# Patient Record
Sex: Male | Born: 1990 | Race: Black or African American | Hispanic: No | Marital: Single | Smoking: Current every day smoker
Health system: Southern US, Community
[De-identification: ages and names within clinical notes are randomized; demographics above are authoritative.]

## PROBLEM LIST (undated history)

## (undated) DIAGNOSIS — Y249XXA Unspecified firearm discharge, undetermined intent, initial encounter: Secondary | ICD-10-CM

## (undated) DIAGNOSIS — W3400XA Accidental discharge from unspecified firearms or gun, initial encounter: Secondary | ICD-10-CM

## (undated) HISTORY — PX: HAND SURGERY: SHX662

---
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
## 2012-09-08 ENCOUNTER — Encounter (HOSPITAL_BASED_OUTPATIENT_CLINIC_OR_DEPARTMENT_OTHER): Payer: Self-pay | Admitting: Family Medicine

## 2012-09-08 ENCOUNTER — Emergency Department (HOSPITAL_BASED_OUTPATIENT_CLINIC_OR_DEPARTMENT_OTHER)
Admission: EM | Admit: 2012-09-08 | Discharge: 2012-09-08 | Disposition: A | Discharge status: Home / Self Care | Attending: Emergency Medicine | Admitting: Emergency Medicine

## 2012-09-08 DIAGNOSIS — H53149 Visual discomfort, unspecified: Secondary | ICD-10-CM | POA: Insufficient documentation

## 2012-09-08 DIAGNOSIS — H109 Unspecified conjunctivitis: Secondary | ICD-10-CM | POA: Insufficient documentation

## 2012-09-08 DIAGNOSIS — H5789 Other specified disorders of eye and adnexa: Secondary | ICD-10-CM | POA: Insufficient documentation

## 2012-09-08 DIAGNOSIS — H579 Unspecified disorder of eye and adnexa: Secondary | ICD-10-CM | POA: Insufficient documentation

## 2012-09-08 MED ORDER — FLUORESCEIN SODIUM 1 MG OP STRP
ORAL_STRIP | OPHTHALMIC | Status: AC
  Administered 2012-09-08: 1 via OPHTHALMIC
  Filled 2012-09-08: qty 1

## 2012-09-08 MED ORDER — FLUORESCEIN SODIUM 1 MG OP STRP
ORAL_STRIP | OPHTHALMIC | Status: AC
  Administered 2012-09-08: 1
  Filled 2012-09-08: qty 1

## 2012-09-08 MED ORDER — TETRACAINE HCL 0.5 % OP SOLN
2.0000 [drp] | Freq: Once | OPHTHALMIC | Status: AC
  Administered 2012-09-08: 2 [drp] via OPHTHALMIC
  Filled 2012-09-08: qty 2

## 2012-09-08 MED ORDER — FLUORESCEIN SODIUM 1 MG OP STRP
ORAL_STRIP | OPHTHALMIC | Status: AC
  Administered 2012-09-08: 1
  Filled 2012-09-08: qty 2

## 2012-09-08 NOTE — ED Provider Notes (Signed)
History     CSN: [id number]  Arrival date & time 09/08/12  1112   First MD Initiated Contact with Patient 09/08/12 1200      Chief Complaint  Patient presents with  . Eye Problem    (Consider location/radiation/quality/duration/timing/severity/associated sxs/prior treatment) HPI Comments: Pt comes in with cc of eye problems. Pt started having bilateral ear redness and tearing 3-4 days ago. He is a prison inmate, and was started on opthalmic tobramycin and artificial tears - but overtime, he has increased redness and tearing from the eye. Pt denies any allergy hx, no similar sx in the past. He now has crusting of the eye in the morning, and yellowish discharge. No penile discharge, no hx of STD, no hx of autoimmune disease. Associated with the tearing patient has some light sensitivity - mostly on the left eye.   Patient is a 21 y.o. male presenting with eye problem. The history is provided by the patient.  Eye Problem  Associated symptoms include discharge, photophobia and eye redness.    History reviewed. No pertinent past medical history.  History reviewed. No pertinent past surgical history.  No family history on file.  History  Substance Use Topics  . Smoking status: Not on file  . Smokeless tobacco: Not on file  . Alcohol Use: Not on file      Review of Systems  Eyes: Positive for photophobia, pain, discharge, redness and itching. Negative for visual disturbance.  Respiratory: Negative for cough and shortness of breath.   Cardiovascular: Negative for chest pain.  Gastrointestinal: Negative for abdominal pain.  Genitourinary: Negative for dysuria, discharge and penile pain.    Allergies  Review of patient's allergies indicates no known allergies.  Home Medications   Current Outpatient Rx  Name  Route  Sig  Dispense  Refill  . TOBRAMYCIN SULFATE 0.3 % OP SOLN      1 drop every 4 (four) hours.           BP 129/91  Pulse 83  Temp 98.4 F (36.9 C)  (Oral)  Resp 16  Ht 5\' 9"  (1.753 m)  Wt 185 lb (83.915 kg)  BMI 27.32 kg/m2  SpO2 99%  Physical Exam  Nursing note and vitals reviewed. Constitutional: He is oriented to person, place, and time. He appears well-developed.  HENT:  Head: Normocephalic and atraumatic.  Eyes: EOM are normal. Pupils are equal, round, and reactive to light. Right eye exhibits discharge. Left eye exhibits discharge.       Bilateral eye exam shows periorbital edema, right worse than left. There is clear tearing noted. Pt has erythema of the eye, but there is no associated warmth to touch, and he has no eye pain when eyes are closed or with movement of the eyes. Slit lamp exam with fluorescein:  No abrasions, no ulcers, no foreign body. Pt does NOT have consensual photophobia, but endorses to direct photophobia   Neck: Normal range of motion. Neck supple.  Cardiovascular: Normal rate and regular rhythm.   Pulmonary/Chest: Effort normal and breath sounds normal.  Abdominal: Soft. Bowel sounds are normal. He exhibits no distension. There is no tenderness. There is no rebound and no guarding.  Neurological: He is alert and oriented to person, place, and time.  Skin: Skin is warm.    ED Course  Procedures (including critical care time)  Labs Reviewed - No data to display No results found.   1. Conjunctivitis of both eyes       MDM  Pt comes in with bilateral eye redness.  Vision is intact. No pain with eye movement  - no concerns for orbital cellulitis. No consensual photophobia  - so likely no keratits/uveitis - but he might be in the initial state of that condition. No contact len used.  Pt is on allergy meds, he is on tobramycin - which i think is appropriate for now. i will set up an appt with Opthalmology, to get a comprehensive eye exam, and to make sure his condition is not getting worse.  2:34 PM Set up an appt for Monday.    Derwood Kaplan, MD 09/08/12 1435

## 2012-09-08 NOTE — ED Notes (Addendum)
Pt c/o bilateral eye swelling and redness, burning and drainage right worse than left. Pt is incarcerated and here in custody. Pt currently of [employer] and being treated for pink eye with Tobramycin drops.

## 2012-09-08 NOTE — ED Notes (Signed)
Visual acuity. Right eye 20/25 left eye 20/20.

## 2013-04-05 ENCOUNTER — Emergency Department (HOSPITAL_COMMUNITY)
Admission: EM | Admit: 2013-04-05 | Discharge: 2013-04-05 | Disposition: A | Discharge status: Home / Self Care | Payer: No Typology Code available for payment source | Attending: Emergency Medicine | Admitting: Emergency Medicine

## 2013-04-05 ENCOUNTER — Emergency Department (HOSPITAL_COMMUNITY): Payer: No Typology Code available for payment source

## 2013-04-05 ENCOUNTER — Encounter (HOSPITAL_COMMUNITY): Payer: Self-pay | Admitting: *Deleted

## 2013-04-05 DIAGNOSIS — S8990XA Unspecified injury of unspecified lower leg, initial encounter: Secondary | ICD-10-CM | POA: Insufficient documentation

## 2013-04-05 DIAGNOSIS — IMO0002 Reserved for concepts with insufficient information to code with codable children: Secondary | ICD-10-CM | POA: Insufficient documentation

## 2013-04-05 DIAGNOSIS — Y9389 Activity, other specified: Secondary | ICD-10-CM | POA: Insufficient documentation

## 2013-04-05 DIAGNOSIS — M7918 Myalgia, other site: Secondary | ICD-10-CM

## 2013-04-05 DIAGNOSIS — S99919A Unspecified injury of unspecified ankle, initial encounter: Secondary | ICD-10-CM | POA: Insufficient documentation

## 2013-04-05 DIAGNOSIS — Y92410 Unspecified street and highway as the place of occurrence of the external cause: Secondary | ICD-10-CM | POA: Insufficient documentation

## 2013-04-05 DIAGNOSIS — F17200 Nicotine dependence, unspecified, uncomplicated: Secondary | ICD-10-CM | POA: Insufficient documentation

## 2013-04-05 DIAGNOSIS — S0993XA Unspecified injury of face, initial encounter: Secondary | ICD-10-CM | POA: Insufficient documentation

## 2013-04-05 MED ORDER — NAPROXEN 500 MG PO TABS: 500.0000 mg | ORAL_TABLET | Freq: Two times a day (BID) | ORAL | Status: DC

## 2013-04-05 MED ORDER — CYCLOBENZAPRINE HCL 10 MG PO TABS: 10.0000 mg | ORAL_TABLET | Freq: Two times a day (BID) | ORAL | Status: DC | PRN

## 2013-04-05 MED ORDER — IBUPROFEN 400 MG PO TABS
800.0000 mg | ORAL_TABLET | Freq: Once | ORAL | Status: AC
  Administered 2013-04-05: 800 mg via ORAL
  Filled 2013-04-05: qty 2

## 2013-04-05 NOTE — ED Notes (Signed)
Pt comfortable with d/c and f/u instructions. Prescriptions x2. 
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
## 2013-04-05 NOTE — ED Notes (Signed)
Pt returned from Xray. Alert, interactive, calm

## 2013-04-08 NOTE — ED Provider Notes (Signed)
Medical screening examination/treatment/procedure(s) were performed by non-physician practitioner and as supervising physician I was immediately available for consultation/collaboration.   Ashby Dawes, MD 04/08/13 [phone]

## 2014-02-09 ENCOUNTER — Observation Stay (HOSPITAL_COMMUNITY): Admitting: Anesthesiology

## 2014-02-09 ENCOUNTER — Observation Stay (HOSPITAL_COMMUNITY)
Admission: EM | Admit: 2014-02-09 | Discharge: 2014-02-09 | Disposition: A | Discharge status: Home / Self Care | Payer: Self-pay | Attending: Surgery | Admitting: Surgery

## 2014-02-09 ENCOUNTER — Emergency Department (HOSPITAL_COMMUNITY): Payer: Self-pay

## 2014-02-09 ENCOUNTER — Encounter (HOSPITAL_COMMUNITY)
Admission: EM | Disposition: A | Discharge status: Home / Self Care | Payer: Self-pay | Source: Home / Self Care | Attending: Emergency Medicine

## 2014-02-09 ENCOUNTER — Encounter (HOSPITAL_COMMUNITY): Payer: Self-pay | Admitting: Anesthesiology

## 2014-02-09 ENCOUNTER — Encounter (HOSPITAL_COMMUNITY): Payer: Self-pay | Admitting: *Deleted

## 2014-02-09 DIAGNOSIS — Z79899 Other long term (current) drug therapy: Secondary | ICD-10-CM | POA: Insufficient documentation

## 2014-02-09 DIAGNOSIS — W3400XA Accidental discharge from unspecified firearms or gun, initial encounter: Secondary | ICD-10-CM

## 2014-02-09 DIAGNOSIS — Z23 Encounter for immunization: Secondary | ICD-10-CM | POA: Insufficient documentation

## 2014-02-09 DIAGNOSIS — S3991XA Unspecified injury of abdomen, initial encounter: Secondary | ICD-10-CM | POA: Diagnosis present

## 2014-02-09 DIAGNOSIS — S62309B Unspecified fracture of unspecified metacarpal bone, initial encounter for open fracture: Principal | ICD-10-CM | POA: Insufficient documentation

## 2014-02-09 DIAGNOSIS — S61409A Unspecified open wound of unspecified hand, initial encounter: Secondary | ICD-10-CM

## 2014-02-09 DIAGNOSIS — S61431A Puncture wound without foreign body of right hand, initial encounter: Secondary | ICD-10-CM

## 2014-02-09 DIAGNOSIS — R109 Unspecified abdominal pain: Secondary | ICD-10-CM

## 2014-02-09 DIAGNOSIS — F17200 Nicotine dependence, unspecified, uncomplicated: Secondary | ICD-10-CM | POA: Insufficient documentation

## 2014-02-09 DIAGNOSIS — S62306A Unspecified fracture of fifth metacarpal bone, right hand, initial encounter for closed fracture: Secondary | ICD-10-CM | POA: Diagnosis present

## 2014-02-09 DIAGNOSIS — S31109A Unspecified open wound of abdominal wall, unspecified quadrant without penetration into peritoneal cavity, initial encounter: Secondary | ICD-10-CM

## 2014-02-09 DIAGNOSIS — S31139A Puncture wound of abdominal wall without foreign body, unspecified quadrant without penetration into peritoneal cavity, initial encounter: Secondary | ICD-10-CM

## 2014-02-09 HISTORY — PX: I&D EXTREMITY: SHX5045

## 2014-02-09 LAB — CBC
HEMATOCRIT: 43.9 % (ref 39.0–52.0)
Hemoglobin: 14.5 g/dL (ref 13.0–17.0)
MCH: 27.6 pg (ref 26.0–34.0)
MCHC: 33 g/dL (ref 30.0–36.0)
MCV: 83.6 fL (ref 78.0–100.0)
PLATELETS: 302 10*3/uL (ref 150–400)
RBC: 5.25 MIL/uL (ref 4.22–5.81)
RDW: 13.1 % (ref 11.5–15.5)
WBC: 6.6 10*3/uL (ref 4.0–10.5)

## 2014-02-09 LAB — TYPE AND SCREEN
ABO/RH(D): A POS
ANTIBODY SCREEN: NEGATIVE
UNIT DIVISION: 0
UNIT DIVISION: 0

## 2014-02-09 LAB — PREPARE FRESH FROZEN PLASMA
UNIT DIVISION: 0
UNIT DIVISION: 0

## 2014-02-09 LAB — COMPREHENSIVE METABOLIC PANEL
ALBUMIN: 4.6 g/dL (ref 3.5–5.2)
ALK PHOS: 81 U/L (ref 39–117)
ALT: 15 U/L (ref 0–53)
AST: 20 U/L (ref 0–37)
BUN: 14 mg/dL (ref 6–23)
CO2: 24 mEq/L (ref 19–32)
CREATININE: 1.06 mg/dL (ref 0.50–1.35)
Calcium: 9.2 mg/dL (ref 8.4–10.5)
Chloride: 100 mEq/L (ref 96–112)
GFR calc non Af Amer: >90 mL/min (ref >90)
GLUCOSE: 142 mg/dL — AB (ref 70–99)
POTASSIUM: 3.5 meq/L — AB (ref 3.7–5.3)
Sodium: 139 mEq/L (ref 137–147)
TOTAL PROTEIN: 7.8 g/dL (ref 6.0–8.3)
Total Bilirubin: 0.3 mg/dL (ref 0.3–1.2)

## 2014-02-09 LAB — URINALYSIS, ROUTINE W REFLEX MICROSCOPIC
Bilirubin Urine: NEGATIVE
GLUCOSE, UA: NEGATIVE mg/dL
Ketones, ur: NEGATIVE mg/dL
Leukocytes, UA: NEGATIVE
Nitrite: NEGATIVE
PROTEIN: NEGATIVE mg/dL
Specific Gravity, Urine: 1.025 (ref 1.005–1.030)
UROBILINOGEN UA: 0.2 mg/dL (ref 0.0–1.0)
pH: 7.5 (ref 5.0–8.0)

## 2014-02-09 LAB — I-STAT CHEM 8, ED
BUN: 13 mg/dL (ref 6–23)
CALCIUM ION: 1.13 mmol/L (ref 1.12–1.23)
CREATININE: 1.4 mg/dL — AB (ref 0.50–1.35)
Chloride: 99 mEq/L (ref 96–112)
GLUCOSE: 137 mg/dL — AB (ref 70–99)
HEMATOCRIT: 48 % (ref 39.0–52.0)
HEMOGLOBIN: 16.3 g/dL (ref 13.0–17.0)
POTASSIUM: 3.4 meq/L — AB (ref 3.7–5.3)
Sodium: 142 mEq/L (ref 137–147)
TCO2: 26 mmol/L (ref 0–100)

## 2014-02-09 LAB — URINE MICROSCOPIC-ADD ON

## 2014-02-09 LAB — BLOOD PRODUCT ORDER (VERBAL) VERIFICATION

## 2014-02-09 LAB — I-STAT CG4 LACTIC ACID, ED: Lactic Acid, Venous: 3.58 mmol/L — ABNORMAL HIGH (ref 0.5–2.2)

## 2014-02-09 LAB — APTT: aPTT: 26 seconds (ref 24–37)

## 2014-02-09 LAB — ETHANOL: ALCOHOL ETHYL (B): 163 mg/dL — AB (ref 0–11)

## 2014-02-09 LAB — CDS SEROLOGY

## 2014-02-09 LAB — ABO/RH: ABO/RH(D): A POS

## 2014-02-09 LAB — PROTIME-INR
INR: 0.97 (ref 0.00–1.49)
PROTHROMBIN TIME: 12.7 s (ref 11.6–15.2)

## 2014-02-09 SURGERY — IRRIGATION AND DEBRIDEMENT EXTREMITY
Anesthesia: General | Site: Hand | Laterality: Right

## 2014-02-09 MED ORDER — HYDROCODONE-ACETAMINOPHEN 5-325 MG PO TABS: 2.0000 | ORAL_TABLET | ORAL | Status: DC | PRN

## 2014-02-09 MED ORDER — PROPOFOL 10 MG/ML IV BOLUS
INTRAVENOUS | Status: AC
  Filled 2014-02-09: qty 20

## 2014-02-09 MED ORDER — SODIUM CHLORIDE 0.9 % IR SOLN
Status: DC | PRN
  Administered 2014-02-09: 12:00:00

## 2014-02-09 MED ORDER — FENTANYL CITRATE 0.05 MG/ML IJ SOLN
INTRAMUSCULAR | Status: AC
  Filled 2014-02-09: qty 5

## 2014-02-09 MED ORDER — OXYCODONE HCL 5 MG/5ML PO SOLN: 5.0000 mg | Freq: Once | ORAL | Status: DC | PRN

## 2014-02-09 MED ORDER — SODIUM CHLORIDE 0.9 % IV SOLN
INTRAVENOUS | Status: AC | PRN
  Administered 2014-02-09: 125 mL/h via INTRAVENOUS

## 2014-02-09 MED ORDER — HEPARIN SODIUM (PORCINE) 5000 UNIT/ML IJ SOLN: 5000.0000 [IU] | Freq: Three times a day (TID) | INTRAMUSCULAR | Status: DC

## 2014-02-09 MED ORDER — ARTIFICIAL TEARS OP OINT
TOPICAL_OINTMENT | OPHTHALMIC | Status: DC | PRN
  Administered 2014-02-09: 1 via OPHTHALMIC

## 2014-02-09 MED ORDER — NEOSTIGMINE METHYLSULFATE 10 MG/10ML IV SOLN
INTRAVENOUS | Status: AC
  Filled 2014-02-09: qty 1

## 2014-02-09 MED ORDER — TETANUS-DIPHTHERIA TOXOIDS TD 5-2 LFU IM INJ
0.5000 mL | INJECTION | Freq: Once | INTRAMUSCULAR | Status: AC
  Administered 2014-02-09: 0.5 mL via INTRAMUSCULAR
  Filled 2014-02-09: qty 0.5

## 2014-02-09 MED ORDER — LACTATED RINGERS IV SOLN
INTRAVENOUS | Status: DC | PRN
  Administered 2014-02-09: 12:00:00 via INTRAVENOUS

## 2014-02-09 MED ORDER — HYDROCODONE-ACETAMINOPHEN 5-325 MG PO TABS: 1.0000 | ORAL_TABLET | ORAL | Status: DC | PRN

## 2014-02-09 MED ORDER — LIDOCAINE HCL (CARDIAC) 20 MG/ML IV SOLN
INTRAVENOUS | Status: DC | PRN
  Administered 2014-02-09: 50 mg via INTRAVENOUS

## 2014-02-09 MED ORDER — MIDAZOLAM HCL 2 MG/2ML IJ SOLN
INTRAMUSCULAR | Status: AC
  Filled 2014-02-09: qty 2

## 2014-02-09 MED ORDER — ONDANSETRON HCL 4 MG/2ML IJ SOLN
INTRAMUSCULAR | Status: DC | PRN
  Administered 2014-02-09: 4 mg via INTRAVENOUS

## 2014-02-09 MED ORDER — MORPHINE SULFATE 2 MG/ML IJ SOLN
1.0000 mg | INTRAMUSCULAR | Status: DC | PRN
  Administered 2014-02-09: 2 mg via INTRAVENOUS
  Filled 2014-02-09: qty 1

## 2014-02-09 MED ORDER — GLYCOPYRROLATE 0.2 MG/ML IJ SOLN
INTRAMUSCULAR | Status: AC
  Filled 2014-02-09: qty 2

## 2014-02-09 MED ORDER — MORPHINE SULFATE 2 MG/ML IJ SOLN: 2.0000 mg | INTRAMUSCULAR | Status: DC | PRN

## 2014-02-09 MED ORDER — ARTIFICIAL TEARS OP OINT
TOPICAL_OINTMENT | OPHTHALMIC | Status: AC
  Filled 2014-02-09: qty 3.5

## 2014-02-09 MED ORDER — ONDANSETRON HCL 4 MG/2ML IJ SOLN: 4.0000 mg | Freq: Four times a day (QID) | INTRAMUSCULAR | Status: DC | PRN

## 2014-02-09 MED ORDER — ONDANSETRON HCL 4 MG/2ML IJ SOLN
INTRAMUSCULAR | Status: AC
  Filled 2014-02-09: qty 2

## 2014-02-09 MED ORDER — PROPOFOL 10 MG/ML IV BOLUS
INTRAVENOUS | Status: DC | PRN
  Administered 2014-02-09: 200 mg via INTRAVENOUS

## 2014-02-09 MED ORDER — POTASSIUM CHLORIDE IN NACL 20-0.45 MEQ/L-% IV SOLN
INTRAVENOUS | Status: DC
  Administered 2014-02-09 (×2): via INTRAVENOUS
  Filled 2014-02-09 (×4): qty 1000

## 2014-02-09 MED ORDER — MORPHINE SULFATE 4 MG/ML IJ SOLN
4.0000 mg | Freq: Once | INTRAMUSCULAR | Status: AC
  Administered 2014-02-09: 4 mg via INTRAVENOUS
  Filled 2014-02-09: qty 1

## 2014-02-09 MED ORDER — IOHEXOL 300 MG/ML  SOLN
100.0000 mL | Freq: Once | INTRAMUSCULAR | Status: AC | PRN
  Administered 2014-02-09: 100 mL via INTRAVENOUS

## 2014-02-09 MED ORDER — CEFAZOLIN SODIUM-DEXTROSE 2-3 GM-% IV SOLR
INTRAVENOUS | Status: DC | PRN
  Administered 2014-02-09: 2 g via INTRAVENOUS

## 2014-02-09 MED ORDER — HYDROMORPHONE HCL PF 1 MG/ML IJ SOLN: 0.2500 mg | INTRAMUSCULAR | Status: DC | PRN

## 2014-02-09 MED ORDER — FENTANYL CITRATE 0.05 MG/ML IJ SOLN
INTRAMUSCULAR | Status: DC | PRN
  Administered 2014-02-09: 100 ug via INTRAVENOUS
  Administered 2014-02-09: 50 ug via INTRAVENOUS

## 2014-02-09 MED ORDER — CEFAZOLIN SODIUM 1-5 GM-% IV SOLN
1.0000 g | Freq: Once | INTRAVENOUS | Status: AC
  Administered 2014-02-09: 1 g via INTRAVENOUS
  Filled 2014-02-09: qty 50

## 2014-02-09 MED ORDER — ONDANSETRON HCL 4 MG/2ML IJ SOLN: 4.0000 mg | Freq: Once | INTRAMUSCULAR | Status: DC | PRN

## 2014-02-09 MED ORDER — SODIUM CHLORIDE 0.9 % IV SOLN: Freq: Once | INTRAVENOUS | Status: DC

## 2014-02-09 MED ORDER — TRAMADOL HCL 50 MG PO TABS: 50.0000 mg | ORAL_TABLET | Freq: Four times a day (QID) | ORAL | Status: DC | PRN

## 2014-02-09 MED ORDER — SODIUM CHLORIDE 0.9 % IR SOLN
Status: DC | PRN
  Administered 2014-02-09: 1000 mL

## 2014-02-09 MED ORDER — HYDROCODONE-ACETAMINOPHEN 10-325 MG PO TABS: 0.5000 | ORAL_TABLET | ORAL | Status: DC | PRN

## 2014-02-09 MED ORDER — BUPIVACAINE HCL (PF) 0.25 % IJ SOLN
INTRAMUSCULAR | Status: DC | PRN
  Administered 2014-02-09: 10 mL

## 2014-02-09 MED ORDER — ENOXAPARIN SODIUM 40 MG/0.4ML ~~LOC~~ SOLN
40.0000 mg | SUBCUTANEOUS | Status: DC
  Filled 2014-02-09: qty 0.4

## 2014-02-09 MED ORDER — ROCURONIUM BROMIDE 50 MG/5ML IV SOLN
INTRAVENOUS | Status: AC
  Filled 2014-02-09: qty 1

## 2014-02-09 MED ORDER — ONDANSETRON HCL 4 MG PO TABS: 4.0000 mg | ORAL_TABLET | Freq: Four times a day (QID) | ORAL | Status: DC | PRN

## 2014-02-09 MED ORDER — MIDAZOLAM HCL 5 MG/5ML IJ SOLN
INTRAMUSCULAR | Status: DC | PRN
  Administered 2014-02-09: 2 mg via INTRAVENOUS

## 2014-02-09 MED ORDER — MORPHINE SULFATE 4 MG/ML IJ SOLN
6.0000 mg | Freq: Once | INTRAMUSCULAR | Status: AC
  Administered 2014-02-09: 6 mg via INTRAVENOUS
  Filled 2014-02-09: qty 2

## 2014-02-09 MED ORDER — OXYCODONE HCL 5 MG PO TABS: 5.0000 mg | ORAL_TABLET | Freq: Once | ORAL | Status: DC | PRN

## 2014-02-09 MED ORDER — CEPHALEXIN 500 MG PO CAPS: 500.0000 mg | ORAL_CAPSULE | Freq: Four times a day (QID) | ORAL | Status: DC

## 2014-02-09 MED ORDER — CEFAZOLIN SODIUM 1-5 GM-% IV SOLN
1.0000 g | Freq: Three times a day (TID) | INTRAVENOUS | Status: DC
  Filled 2014-02-09 (×3): qty 50

## 2014-02-09 SURGICAL SUPPLY — 45 items
BAG DECANTER FOR FLEXI CONT (MISCELLANEOUS) ×3 IMPLANT
BANDAGE ELASTIC 3 VELCRO ST LF (GAUZE/BANDAGES/DRESSINGS) IMPLANT
BANDAGE ELASTIC 4 VELCRO ST LF (GAUZE/BANDAGES/DRESSINGS) IMPLANT
BANDAGE GAUZE ELAST BULKY 4 IN (GAUZE/BANDAGES/DRESSINGS) IMPLANT
BNDG ELASTIC 2 VLCR STRL LF (GAUZE/BANDAGES/DRESSINGS) ×3 IMPLANT
CORDS BIPOLAR (ELECTRODE) ×3 IMPLANT
CUFF TOURNIQUET SINGLE 18IN (TOURNIQUET CUFF) IMPLANT
DRAPE SURG 17X23 STRL (DRAPES) ×3 IMPLANT
ELECT REM PT RETURN 9FT ADLT (ELECTROSURGICAL)
ELECTRODE REM PT RTRN 9FT ADLT (ELECTROSURGICAL) IMPLANT
GAUZE PACKING IODOFORM 1/4X5 (PACKING) IMPLANT
GAUZE XEROFORM 1X8 LF (GAUZE/BANDAGES/DRESSINGS) ×3 IMPLANT
GLOVE BIOGEL M STRL SZ7.5 (GLOVE) ×3 IMPLANT
GLOVE BIOGEL PI IND STRL 6.5 (GLOVE) ×1 IMPLANT
GLOVE BIOGEL PI IND STRL 7.0 (GLOVE) ×2 IMPLANT
GLOVE BIOGEL PI INDICATOR 6.5 (GLOVE) ×2
GLOVE BIOGEL PI INDICATOR 7.0 (GLOVE) ×4
GOWN STRL REUS W/ TWL LRG LVL3 (GOWN DISPOSABLE) ×2 IMPLANT
GOWN STRL REUS W/TWL LRG LVL3 (GOWN DISPOSABLE) ×4
HANDPIECE INTERPULSE COAX TIP (DISPOSABLE)
KIT BASIN OR (CUSTOM PROCEDURE TRAY) ×3 IMPLANT
KIT ROOM TURNOVER OR (KITS) ×3 IMPLANT
MANIFOLD NEPTUNE II (INSTRUMENTS) ×3 IMPLANT
NEEDLE HYPO 25GX1X1/2 BEV (NEEDLE) ×3 IMPLANT
NS IRRIG 1000ML POUR BTL (IV SOLUTION) ×3 IMPLANT
PACK ORTHO EXTREMITY (CUSTOM PROCEDURE TRAY) ×3 IMPLANT
PAD ARMBOARD 7.5X6 YLW CONV (MISCELLANEOUS) ×6 IMPLANT
PAD CAST 4YDX4 CTTN HI CHSV (CAST SUPPLIES) IMPLANT
PADDING CAST COTTON 4X4 STRL (CAST SUPPLIES)
PADDING UNDERCAST 2  STERILE (CAST SUPPLIES) ×3 IMPLANT
SET HNDPC FAN SPRY TIP SCT (DISPOSABLE) IMPLANT
SOAP 2 % CHG 4 OZ (WOUND CARE) ×3 IMPLANT
SPONGE GAUZE 4X4 12PLY (GAUZE/BANDAGES/DRESSINGS) ×3 IMPLANT
SPONGE GAUZE 4X4 12PLY STER LF (GAUZE/BANDAGES/DRESSINGS) ×3 IMPLANT
SPONGE LAP 18X18 X RAY DECT (DISPOSABLE) IMPLANT
SPONGE LAP 4X18 X RAY DECT (DISPOSABLE) ×3 IMPLANT
SUT PROLENE 4 0 PS 2 18 (SUTURE) ×3 IMPLANT
SYR CONTROL 10ML LL (SYRINGE) ×3 IMPLANT
TOWEL OR 17X24 6PK STRL BLUE (TOWEL DISPOSABLE) ×3 IMPLANT
TOWEL OR 17X26 10 PK STRL BLUE (TOWEL DISPOSABLE) ×3 IMPLANT
TUBE ANAEROBIC SPECIMEN COL (MISCELLANEOUS) IMPLANT
TUBE CONNECTING 12'X1/4 (SUCTIONS) ×1
TUBE CONNECTING 12X1/4 (SUCTIONS) ×2 IMPLANT
WATER STERILE IRR 1000ML POUR (IV SOLUTION) ×3 IMPLANT
YANKAUER SUCT BULB TIP NO VENT (SUCTIONS) ×3 IMPLANT

## 2014-02-09 NOTE — Discharge Summary (Signed)
Lyndy Russman, MD, MPH, FACS Trauma: [phone] General Surgery: [phone]  

## 2014-02-09 NOTE — Anesthesia Postprocedure Evaluation (Signed)
  Anesthesia Post-op Note  Patient: Logan Gomez  Procedure(s) Performed: Procedure(s): Exploration of wound right hand ,Reduction of fracture 5th metacarpal, Irrigation & debridement of right hand wound (Right)  Patient Location: PACU  Anesthesia Type:General  Level of Consciousness: awake, alert  and oriented  Airway and Oxygen Therapy: Patient Spontanous Breathing  Post-op Pain: mild  Post-op Assessment: Post-op Vital signs reviewed  Post-op Vital Signs: Reviewed  Last Vitals:  Filed Vitals:   02/09/14 1330  BP: 128/70  Pulse: 72  Temp:   Resp: 17    Complications: No apparent anesthesia complications

## 2014-02-09 NOTE — ED Notes (Signed)
I-Stat Lactic Acid results shown to EDP. 
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
## 2014-02-09 NOTE — Progress Notes (Signed)
Pt provided with MATCH letter and his prescriptions along with explanation for how to use the [facility] program. Pt stated understanding.

## 2014-02-09 NOTE — Progress Notes (Signed)
UR completed 
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
## 2014-02-09 NOTE — Transfer of Care (Signed)
Immediate Anesthesia Transfer of Care Note  Patient: Logan Gomez  Procedure(s) Performed: Procedure(s): Exploration of wound right hand ,Reduction of fracture 5th metacarpal, Irrigation & debridement of right hand wound (Right)  Patient Location: PACU  Anesthesia Type:General  Level of Consciousness: awake, alert  and oriented  Airway & Oxygen Therapy: Patient Spontanous Breathing and Patient connected to face mask oxygen  Post-op Assessment: Report given to PACU RN  Post vital signs: Reviewed and stable  Complications: No apparent anesthesia complications
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
## 2014-02-09 NOTE — Discharge Instructions (Signed)
Discharge Instructions:  Keep your dressing clean, dry and in place until instructed to remove by Dr. Izora Ribas.  If the dressing becomes dirty or wet call the office for instructions during business hours. Elevate the extremity to help with swelling, this will also help with any discomfort. Take your medication as prescribed. No lifting with the injured  extremity. If you feel that the dressing is too tight, you may loosen it, but keep it on; finger tips should be pink; if there is a concern, call the office. [phone] Ice may be used if the injury is a fracture, do not apply ice directly to the skin. Please call the office on the next business day after discharge to arrange a follow up appointment.  Call [phone] between the hours of 9am - 5pm M-Th or 9am - 1pm on Fri. For most hand injuries and/or conditions, you may return to work using the uninjured hand (one handed duty) within 24-72 hours.  A detailed note will be provided to you at your follow up appointment or may contact the office prior to your follow up.   Wash other wounds daily with soap and water. Do not soak. Apply antibiotic ointment (e.g. Neosporin) twice daily and as needed to keep moist. Cover with dry dressing.  Return to the hospital immediately with fever, increased abdominal pain, nausea, or vomiting.

## 2014-02-09 NOTE — ED Notes (Signed)
Family updated as to patient's status.
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
## 2014-02-13 ENCOUNTER — Encounter (HOSPITAL_COMMUNITY): Payer: Self-pay | Admitting: General Surgery

## 2014-02-22 ENCOUNTER — Encounter (HOSPITAL_COMMUNITY): Payer: Self-pay | Admitting: Emergency Medicine

## 2014-02-22 ENCOUNTER — Emergency Department (HOSPITAL_COMMUNITY)
Admission: EM | Admit: 2014-02-22 | Discharge: 2014-02-22 | Disposition: A | Discharge status: Home / Self Care | Attending: Emergency Medicine | Admitting: Emergency Medicine

## 2014-02-22 DIAGNOSIS — S31139A Puncture wound of abdominal wall without foreign body, unspecified quadrant without penetration into peritoneal cavity, initial encounter: Secondary | ICD-10-CM

## 2014-02-22 DIAGNOSIS — M79609 Pain in unspecified limb: Secondary | ICD-10-CM | POA: Insufficient documentation

## 2014-02-22 DIAGNOSIS — S61431A Puncture wound without foreign body of right hand, initial encounter: Secondary | ICD-10-CM

## 2014-02-22 DIAGNOSIS — S62306A Unspecified fracture of fifth metacarpal bone, right hand, initial encounter for closed fracture: Secondary | ICD-10-CM

## 2014-02-22 DIAGNOSIS — W3400XA Accidental discharge from unspecified firearms or gun, initial encounter: Secondary | ICD-10-CM

## 2014-02-22 DIAGNOSIS — IMO0001 Reserved for inherently not codable concepts without codable children: Secondary | ICD-10-CM | POA: Insufficient documentation

## 2014-02-22 DIAGNOSIS — Z792 Long term (current) use of antibiotics: Secondary | ICD-10-CM | POA: Insufficient documentation

## 2014-02-22 DIAGNOSIS — F17200 Nicotine dependence, unspecified, uncomplicated: Secondary | ICD-10-CM | POA: Insufficient documentation

## 2014-02-22 DIAGNOSIS — R109 Unspecified abdominal pain: Secondary | ICD-10-CM | POA: Insufficient documentation

## 2014-02-22 MED ORDER — HYDROCODONE-ACETAMINOPHEN 5-325 MG PO TABS: 1.0000 | ORAL_TABLET | Freq: Four times a day (QID) | ORAL | Status: AC | PRN

## 2014-02-22 MED ORDER — HYDROCODONE-ACETAMINOPHEN 5-325 MG PO TABS
2.0000 | ORAL_TABLET | Freq: Once | ORAL | Status: AC
  Administered 2014-02-22: 2 via ORAL
  Filled 2014-02-22: qty 2

## 2014-02-22 MED ORDER — AMOXICILLIN-POT CLAVULANATE 875-125 MG PO TABS: 1.0000 | ORAL_TABLET | Freq: Two times a day (BID) | ORAL | Status: AC

## 2014-02-22 NOTE — Progress Notes (Signed)
Orthopedic Tech Progress Note Patient Details:  Logan Gomez 1991-08-04 [id number]  Ortho Devices Type of Ortho Device: Ulna gutter splint Ortho Device/Splint Interventions: Application   [name] 02/22/2014, 5:30 PM

## 2014-02-22 NOTE — ED Notes (Signed)
MD at bedside. Dr. Docherty. 
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
## 2016-12-21 ENCOUNTER — Emergency Department
Admission: EM | Admit: 2016-12-21 | Discharge: 2016-12-21 | Disposition: A | Discharge status: Home / Self Care | Payer: Self-pay | Attending: Emergency Medicine | Admitting: Emergency Medicine

## 2016-12-21 ENCOUNTER — Emergency Department: Payer: Self-pay

## 2016-12-21 DIAGNOSIS — F17210 Nicotine dependence, cigarettes, uncomplicated: Secondary | ICD-10-CM | POA: Insufficient documentation

## 2016-12-21 DIAGNOSIS — Y999 Unspecified external cause status: Secondary | ICD-10-CM | POA: Insufficient documentation

## 2016-12-21 DIAGNOSIS — Y929 Unspecified place or not applicable: Secondary | ICD-10-CM | POA: Insufficient documentation

## 2016-12-21 DIAGNOSIS — S31103A Unspecified open wound of abdominal wall, right lower quadrant without penetration into peritoneal cavity, initial encounter: Secondary | ICD-10-CM | POA: Insufficient documentation

## 2016-12-21 DIAGNOSIS — Z23 Encounter for immunization: Secondary | ICD-10-CM | POA: Insufficient documentation

## 2016-12-21 DIAGNOSIS — Y9301 Activity, walking, marching and hiking: Secondary | ICD-10-CM | POA: Insufficient documentation

## 2016-12-21 DIAGNOSIS — W3400XA Accidental discharge from unspecified firearms or gun, initial encounter: Secondary | ICD-10-CM | POA: Insufficient documentation

## 2016-12-21 DIAGNOSIS — S31139A Puncture wound of abdominal wall without foreign body, unspecified quadrant without penetration into peritoneal cavity, initial encounter: Secondary | ICD-10-CM

## 2016-12-21 DIAGNOSIS — Z79899 Other long term (current) drug therapy: Secondary | ICD-10-CM | POA: Insufficient documentation

## 2016-12-21 HISTORY — DX: Unspecified firearm discharge, undetermined intent, initial encounter: Y24.9XXA

## 2016-12-21 HISTORY — DX: Accidental discharge from unspecified firearms or gun, initial encounter: W34.00XA

## 2016-12-21 LAB — URINE DRUG SCREEN, QUALITATIVE (ARMC ONLY)
Amphetamines, Ur Screen: NOT DETECTED
BARBITURATES, UR SCREEN: NOT DETECTED
BENZODIAZEPINE, UR SCRN: NOT DETECTED
Cannabinoid 50 Ng, Ur ~~LOC~~: POSITIVE — AB
Cocaine Metabolite,Ur ~~LOC~~: NOT DETECTED
MDMA (Ecstasy)Ur Screen: NOT DETECTED
Methadone Scn, Ur: NOT DETECTED
OPIATE, UR SCREEN: POSITIVE — AB
PHENCYCLIDINE (PCP) UR S: NOT DETECTED
Tricyclic, Ur Screen: NOT DETECTED

## 2016-12-21 LAB — COMPREHENSIVE METABOLIC PANEL
ALT: 14 U/L — ABNORMAL LOW (ref 17–63)
AST: 23 U/L (ref 15–41)
Albumin: 4.6 g/dL (ref 3.5–5.0)
Alkaline Phosphatase: 59 U/L (ref 38–126)
Anion gap: 9 (ref 5–15)
BUN: 14 mg/dL (ref 6–20)
CO2: 24 mmol/L (ref 22–32)
CREATININE: 1.07 mg/dL (ref 0.61–1.24)
Calcium: 9.2 mg/dL (ref 8.9–10.3)
Chloride: 107 mmol/L (ref 101–111)
GFR calc Af Amer: >60 mL/min (ref >60)
Glucose, Bld: 148 mg/dL — ABNORMAL HIGH (ref 65–99)
Potassium: 3.4 mmol/L — ABNORMAL LOW (ref 3.5–5.1)
Sodium: 140 mmol/L (ref 135–145)
Total Bilirubin: 1.3 mg/dL — ABNORMAL HIGH (ref 0.3–1.2)
Total Protein: 7.7 g/dL (ref 6.5–8.1)

## 2016-12-21 LAB — CBC WITH DIFFERENTIAL/PLATELET
BAND NEUTROPHILS: 0 %
Basophils Absolute: 0 10*3/uL (ref 0–0.1)
Basophils Relative: 0 %
Blasts: 0 %
EOS ABS: 0.2 10*3/uL (ref 0–0.7)
EOS PCT: 3 %
HCT: 46.6 % (ref 40.0–52.0)
Hemoglobin: 15.5 g/dL (ref 13.0–18.0)
LYMPHS PCT: 44 %
Lymphs Abs: 2.2 10*3/uL (ref 1.0–3.6)
MCH: 27.3 pg (ref 26.0–34.0)
MCHC: 33.3 g/dL (ref 32.0–36.0)
MCV: 82 fL (ref 80.0–100.0)
Metamyelocytes Relative: 0 %
Monocytes Absolute: 0.3 10*3/uL (ref 0.2–1.0)
Monocytes Relative: 6 %
Myelocytes: 0 %
NEUTROS PCT: 47 %
NRBC: 0 /100{WBCs}
Neutro Abs: 2.4 10*3/uL (ref 1.4–6.5)
OTHER: 0 %
Platelets: 316 10*3/uL (ref 150–440)
Promyelocytes Absolute: 0 %
RBC: 5.68 MIL/uL (ref 4.40–5.90)
RDW: 13.3 % (ref 11.5–14.5)
WBC: 5.1 10*3/uL (ref 3.8–10.6)

## 2016-12-21 LAB — URINALYSIS, COMPLETE (UACMP) WITH MICROSCOPIC
BILIRUBIN URINE: NEGATIVE
Bacteria, UA: NONE SEEN
Glucose, UA: NEGATIVE mg/dL
KETONES UR: NEGATIVE mg/dL
Leukocytes, UA: NEGATIVE
NITRITE: NEGATIVE
PROTEIN: NEGATIVE mg/dL
Specific Gravity, Urine: >1.046 — ABNORMAL HIGH (ref 1.005–1.030)
Squamous Epithelial / LPF: NONE SEEN
pH: 7 (ref 5.0–8.0)

## 2016-12-21 LAB — TYPE AND SCREEN
ABO/RH(D): A POS
Antibody Screen: NEGATIVE

## 2016-12-21 LAB — ETHANOL

## 2016-12-21 MED ORDER — MUPIROCIN 2 % EX OINT: TOPICAL_OINTMENT | CUTANEOUS | 0 refills | Status: AC

## 2016-12-21 MED ORDER — SODIUM CHLORIDE 0.9 % IV SOLN
Freq: Once | INTRAVENOUS | Status: AC
  Administered 2016-12-21: 13:00:00 via INTRAVENOUS

## 2016-12-21 MED ORDER — LIDOCAINE-EPINEPHRINE (PF) 1 %-1:200000 IJ SOLN
30.0000 mL | Freq: Once | INTRAMUSCULAR | Status: AC
  Administered 2016-12-21: 30 mL
  Filled 2016-12-21 (×2): qty 30

## 2016-12-21 MED ORDER — TETANUS-DIPHTH-ACELL PERTUSSIS 5-2.5-18.5 LF-MCG/0.5 IM SUSP
0.5000 mL | Freq: Once | INTRAMUSCULAR | Status: AC
  Administered 2016-12-21: 0.5 mL via INTRAMUSCULAR
  Filled 2016-12-21: qty 0.5

## 2016-12-21 MED ORDER — TRAMADOL HCL 50 MG PO TABS: 50.0000 mg | ORAL_TABLET | Freq: Four times a day (QID) | ORAL | 0 refills | Status: AC | PRN

## 2016-12-21 MED ORDER — IOPAMIDOL (ISOVUE-300) INJECTION 61%
100.0000 mL | Freq: Once | INTRAVENOUS | Status: AC | PRN
  Administered 2016-12-21: 100 mL via INTRAVENOUS

## 2016-12-21 MED ORDER — HYDROMORPHONE HCL 1 MG/ML IJ SOLN
1.0000 mg | Freq: Once | INTRAMUSCULAR | Status: AC
  Administered 2016-12-21: 1 mg via INTRAVENOUS
  Filled 2016-12-21: qty 1

## 2016-12-21 MED ORDER — CEPHALEXIN 250 MG PO CAPS: 250.0000 mg | ORAL_CAPSULE | Freq: Four times a day (QID) | ORAL | 0 refills | Status: AC

## 2016-12-21 NOTE — ED Notes (Signed)
MD at bedside with lac cart 

## 2016-12-21 NOTE — ED Notes (Signed)
Bandaged wounds and cleaned up pt.
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
## 2016-12-21 NOTE — ED Notes (Signed)
Patient given blue scrub top.
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
